# Patient Record
Sex: Female | Born: 1980 | Race: Black or African American | Hispanic: No | Marital: Single | Smoking: Never smoker
Health system: Southern US, Community
[De-identification: ages and names within clinical notes are randomized; demographics above are authoritative.]

## PROBLEM LIST (undated history)

## (undated) DIAGNOSIS — Z789 Other specified health status: Secondary | ICD-10-CM

## (undated) HISTORY — PX: LIPOMA EXCISION: SHX5283

---
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
## 2001-07-03 ENCOUNTER — Encounter: Admission: RE | Admit: 2001-07-03 | Discharge: 2001-07-03 | Payer: Self-pay | Admitting: Family Medicine

## 2003-04-12 ENCOUNTER — Emergency Department (HOSPITAL_COMMUNITY): Admission: EM | Admit: 2003-04-12 | Discharge: 2003-04-12 | Payer: Self-pay | Admitting: Emergency Medicine

## 2006-11-21 ENCOUNTER — Encounter (INDEPENDENT_AMBULATORY_CARE_PROVIDER_SITE_OTHER): Payer: Self-pay | Admitting: *Deleted

## 2012-05-29 ENCOUNTER — Emergency Department (HOSPITAL_COMMUNITY): Payer: Self-pay | Admitting: Anesthesiology

## 2012-05-29 ENCOUNTER — Encounter (HOSPITAL_COMMUNITY): Payer: Self-pay | Admitting: *Deleted

## 2012-05-29 ENCOUNTER — Ambulatory Visit (HOSPITAL_COMMUNITY)
Admission: EM | Admit: 2012-05-29 | Discharge: 2012-05-31 | Disposition: A | Discharge status: Home / Self Care | Payer: Self-pay | Attending: General Surgery | Admitting: General Surgery

## 2012-05-29 ENCOUNTER — Ambulatory Visit: Payer: Self-pay | Admitting: Family Medicine

## 2012-05-29 ENCOUNTER — Encounter (HOSPITAL_COMMUNITY)
Admission: EM | Disposition: A | Discharge status: Home / Self Care | Payer: Self-pay | Source: Home / Self Care | Attending: Emergency Medicine

## 2012-05-29 ENCOUNTER — Encounter (HOSPITAL_COMMUNITY): Payer: Self-pay | Admitting: Anesthesiology

## 2012-05-29 ENCOUNTER — Emergency Department (HOSPITAL_COMMUNITY): Payer: Self-pay

## 2012-05-29 VITALS — BP 108/80 | HR 83 | Temp 98.1°F | Resp 16 | Ht 63.0 in | Wt 193.0 lb

## 2012-05-29 DIAGNOSIS — R1032 Left lower quadrant pain: Secondary | ICD-10-CM

## 2012-05-29 DIAGNOSIS — K3580 Unspecified acute appendicitis: Secondary | ICD-10-CM | POA: Insufficient documentation

## 2012-05-29 DIAGNOSIS — K37 Unspecified appendicitis: Secondary | ICD-10-CM

## 2012-05-29 DIAGNOSIS — K5730 Diverticulosis of large intestine without perforation or abscess without bleeding: Secondary | ICD-10-CM | POA: Insufficient documentation

## 2012-05-29 DIAGNOSIS — R1031 Right lower quadrant pain: Secondary | ICD-10-CM

## 2012-05-29 HISTORY — DX: Other specified health status: Z78.9

## 2012-05-29 HISTORY — PX: LAPAROSCOPIC APPENDECTOMY: SHX408

## 2012-05-29 LAB — POCT CBC
Granulocyte percent: 69.7 % (ref 37–80)
HCT, POC: 43.4 % (ref 37.7–47.9)
Hemoglobin: 14.1 g/dL (ref 12.2–16.2)
Lymph, poc: 3.7 — AB (ref 0.6–3.4)
MCH, POC: 30.9 pg (ref 27–31.2)
MCHC: 32.5 g/dL (ref 31.8–35.4)
MCV: 95.2 fL (ref 80–97)
MID (cbc): 0.8 (ref 0–0.9)
MPV: 8.1 fL (ref 0–99.8)
POC Granulocyte: 10.5 — AB (ref 2–6.9)
POC LYMPH PERCENT: 24.8 %L (ref 10–50)
POC MID %: 5.5 % (ref 0–12)
Platelet Count, POC: 203 10*3/uL (ref 142–424)
RBC: 4.56 M/uL (ref 4.04–5.48)
RDW, POC: 13.2 %
WBC: 15 10*3/uL — AB (ref 4.6–10.2)

## 2012-05-29 LAB — POCT UA - MICROSCOPIC ONLY
Casts, Ur, LPF, POC: NEGATIVE
Crystals, Ur, HPF, POC: NEGATIVE
Yeast, UA: NEGATIVE

## 2012-05-29 LAB — COMPREHENSIVE METABOLIC PANEL
AST: 14 U/L (ref 0–37)
Albumin: 3.6 g/dL (ref 3.5–5.2)
Alkaline Phosphatase: 49 U/L (ref 39–117)
Chloride: 100 mEq/L (ref 96–112)
Creatinine, Ser: 0.7 mg/dL (ref 0.50–1.10)
Potassium: 3.2 mEq/L — ABNORMAL LOW (ref 3.5–5.1)
Total Bilirubin: 0.5 mg/dL (ref 0.3–1.2)
Total Protein: 7.5 g/dL (ref 6.0–8.3)

## 2012-05-29 LAB — POCT URINALYSIS DIPSTICK
Bilirubin, UA: NEGATIVE
Blood, UA: NEGATIVE
Glucose, UA: NEGATIVE
Ketones, UA: >=160
Leukocytes, UA: NEGATIVE
Nitrite, UA: NEGATIVE
Spec Grav, UA: >=1.03
Urobilinogen, UA: 0.2
pH, UA: 5

## 2012-05-29 LAB — POCT URINE PREGNANCY: Preg Test, Ur: NEGATIVE

## 2012-05-29 LAB — CBC WITH DIFFERENTIAL/PLATELET
Basophils Absolute: 0 10*3/uL (ref 0.0–0.1)
Basophils Relative: 0 % (ref 0–1)
Eosinophils Absolute: 0 10*3/uL (ref 0.0–0.7)
MCHC: 35.1 g/dL (ref 30.0–36.0)
Neutro Abs: 9.4 10*3/uL — ABNORMAL HIGH (ref 1.7–7.7)
Neutrophils Relative %: 81 % — ABNORMAL HIGH (ref 43–77)
RDW: 12.6 % (ref 11.5–15.5)

## 2012-05-29 LAB — URINALYSIS, ROUTINE W REFLEX MICROSCOPIC
Glucose, UA: NEGATIVE mg/dL
Hgb urine dipstick: NEGATIVE
Leukocytes, UA: NEGATIVE
Protein, ur: NEGATIVE mg/dL
pH: 5.5 (ref 5.0–8.0)

## 2012-05-29 LAB — PREGNANCY, URINE: Preg Test, Ur: NEGATIVE

## 2012-05-29 SURGERY — APPENDECTOMY, LAPAROSCOPIC
Anesthesia: General | Site: Abdomen | Wound class: Dirty or Infected

## 2012-05-29 MED ORDER — DEXTROSE 5 % IV SOLN
2.0000 g | INTRAVENOUS | Status: DC | PRN
  Administered 2012-05-29: 2 g via INTRAVENOUS

## 2012-05-29 MED ORDER — LACTATED RINGERS IV SOLN: INTRAVENOUS | Status: DC

## 2012-05-29 MED ORDER — GLYCOPYRROLATE 0.2 MG/ML IJ SOLN
INTRAMUSCULAR | Status: DC | PRN
  Administered 2012-05-29: .3 mg via INTRAVENOUS
  Administered 2012-05-29: 0.1 mg via INTRAVENOUS

## 2012-05-29 MED ORDER — PROPOFOL 10 MG/ML IV EMUL
INTRAVENOUS | Status: DC | PRN
  Administered 2012-05-29: 200 mg via INTRAVENOUS

## 2012-05-29 MED ORDER — BUPIVACAINE HCL (PF) 0.25 % IJ SOLN
INTRAMUSCULAR | Status: AC
  Filled 2012-05-29: qty 30

## 2012-05-29 MED ORDER — ACETAMINOPHEN 10 MG/ML IV SOLN
INTRAVENOUS | Status: AC
  Filled 2012-05-29: qty 100

## 2012-05-29 MED ORDER — SUCCINYLCHOLINE CHLORIDE 20 MG/ML IJ SOLN
INTRAMUSCULAR | Status: DC | PRN
  Administered 2012-05-29: 140 mg via INTRAVENOUS

## 2012-05-29 MED ORDER — HYDROMORPHONE HCL PF 1 MG/ML IJ SOLN
0.2500 mg | INTRAMUSCULAR | Status: DC | PRN
  Administered 2012-05-30: 0.25 mg via INTRAVENOUS

## 2012-05-29 MED ORDER — LIDOCAINE HCL (CARDIAC) 20 MG/ML IV SOLN
INTRAVENOUS | Status: DC | PRN
  Administered 2012-05-29: 100 mg via INTRAVENOUS

## 2012-05-29 MED ORDER — NEOSTIGMINE METHYLSULFATE 1 MG/ML IJ SOLN
INTRAMUSCULAR | Status: DC | PRN
  Administered 2012-05-29: 3 mg via INTRAVENOUS

## 2012-05-29 MED ORDER — SODIUM CHLORIDE 0.9 % IV BOLUS (SEPSIS)
1000.0000 mL | Freq: Once | INTRAVENOUS | Status: AC
  Administered 2012-05-29: 1000 mL via INTRAVENOUS

## 2012-05-29 MED ORDER — FENTANYL CITRATE 0.05 MG/ML IJ SOLN
100.0000 ug | Freq: Once | INTRAMUSCULAR | Status: AC
  Administered 2012-05-29: 100 ug via INTRAVENOUS
  Filled 2012-05-29: qty 2

## 2012-05-29 MED ORDER — IOHEXOL 300 MG/ML  SOLN
100.0000 mL | Freq: Once | INTRAMUSCULAR | Status: AC | PRN
  Administered 2012-05-29: 100 mL via INTRAVENOUS

## 2012-05-29 MED ORDER — ONDANSETRON HCL 4 MG/2ML IJ SOLN
INTRAMUSCULAR | Status: DC | PRN
  Administered 2012-05-29 (×2): 2 mg via INTRAVENOUS

## 2012-05-29 MED ORDER — LACTATED RINGERS IR SOLN
Status: DC | PRN
  Administered 2012-05-29: 500 mL

## 2012-05-29 MED ORDER — PROMETHAZINE HCL 25 MG/ML IJ SOLN: 6.2500 mg | INTRAMUSCULAR | Status: DC | PRN

## 2012-05-29 MED ORDER — ACETAMINOPHEN 10 MG/ML IV SOLN
INTRAVENOUS | Status: DC | PRN
  Administered 2012-05-29: 1000 mg via INTRAVENOUS

## 2012-05-29 MED ORDER — ONDANSETRON HCL 4 MG/2ML IJ SOLN
4.0000 mg | Freq: Once | INTRAMUSCULAR | Status: AC
  Administered 2012-05-29: 4 mg via INTRAVENOUS
  Filled 2012-05-29: qty 2

## 2012-05-29 MED ORDER — MORPHINE SULFATE 4 MG/ML IJ SOLN
4.0000 mg | Freq: Once | INTRAMUSCULAR | Status: AC
  Administered 2012-05-29: 4 mg via INTRAVENOUS
  Filled 2012-05-29: qty 1

## 2012-05-29 MED ORDER — FENTANYL CITRATE 0.05 MG/ML IJ SOLN
INTRAMUSCULAR | Status: DC | PRN
  Administered 2012-05-29 (×4): 50 ug via INTRAVENOUS

## 2012-05-29 MED ORDER — BUPIVACAINE HCL (PF) 0.25 % IJ SOLN
INTRAMUSCULAR | Status: DC | PRN
  Administered 2012-05-29: 21 mL

## 2012-05-29 MED ORDER — CISATRACURIUM BESYLATE (PF) 10 MG/5ML IV SOLN
INTRAVENOUS | Status: DC | PRN
  Administered 2012-05-29: 2 mg via INTRAVENOUS
  Administered 2012-05-29: 5 mg via INTRAVENOUS

## 2012-05-29 MED ORDER — DEXAMETHASONE SODIUM PHOSPHATE 10 MG/ML IJ SOLN
INTRAMUSCULAR | Status: DC | PRN
  Administered 2012-05-29: 10 mg via INTRAVENOUS

## 2012-05-29 MED ORDER — CEFOXITIN SODIUM-DEXTROSE 1-4 GM-% IV SOLR (PREMIX)
INTRAVENOUS | Status: AC
  Filled 2012-05-29: qty 100

## 2012-05-29 MED ORDER — SODIUM CHLORIDE 0.9 % IV SOLN
INTRAVENOUS | Status: DC | PRN
  Administered 2012-05-29: 21:00:00 via INTRAVENOUS

## 2012-05-29 MED ORDER — LACTATED RINGERS IV SOLN
INTRAVENOUS | Status: DC | PRN
  Administered 2012-05-29 (×2): via INTRAVENOUS

## 2012-05-29 MED ORDER — MIDAZOLAM HCL 5 MG/5ML IJ SOLN
INTRAMUSCULAR | Status: DC | PRN
  Administered 2012-05-29: .5 mg via INTRAVENOUS
  Administered 2012-05-29: 1 mg via INTRAVENOUS

## 2012-05-29 SURGICAL SUPPLY — 41 items
ADH SKN CLS APL DERMABOND .7 (GAUZE/BANDAGES/DRESSINGS) ×1
APL SKNCLS STERI-STRIP NONHPOA (GAUZE/BANDAGES/DRESSINGS)
APPLIER CLIP ROT 10 11.4 M/L (STAPLE)
APR CLP MED LRG 11.4X10 (STAPLE)
BAG SPEC RTRVL LRG 6X4 10 (ENDOMECHANICALS) ×1
BENZOIN TINCTURE PRP APPL 2/3 (GAUZE/BANDAGES/DRESSINGS) IMPLANT
CANISTER SUCTION 2500CC (MISCELLANEOUS) ×2 IMPLANT
CATH FOLEY LATEX FREE 16FR (CATHETERS) ×2 IMPLANT
CLIP APPLIE ROT 10 11.4 M/L (STAPLE) IMPLANT
CLOTH BEACON ORANGE TIMEOUT ST (SAFETY) ×2 IMPLANT
CUTTER FLEX LINEAR 45M (STAPLE) ×2 IMPLANT
DECANTER SPIKE VIAL GLASS SM (MISCELLANEOUS) ×2 IMPLANT
DERMABOND ADVANCED (GAUZE/BANDAGES/DRESSINGS) ×1
DERMABOND ADVANCED .7 DNX12 (GAUZE/BANDAGES/DRESSINGS) ×1 IMPLANT
DRAPE LAPAROSCOPIC ABDOMINAL (DRAPES) ×2 IMPLANT
ELECT REM PT RETURN 9FT ADLT (ELECTROSURGICAL) ×2
ELECTRODE REM PT RTRN 9FT ADLT (ELECTROSURGICAL) ×1 IMPLANT
ENDOLOOP SUT PDS II  0 18 (SUTURE)
ENDOLOOP SUT PDS II 0 18 (SUTURE) IMPLANT
GLOVE BIOGEL PI IND STRL 7.0 (GLOVE) ×1 IMPLANT
GLOVE BIOGEL PI INDICATOR 7.0 (GLOVE) ×1
GLOVE SURG SIGNA 7.5 PF LTX (GLOVE) ×2 IMPLANT
GOWN STRL NON-REIN LRG LVL3 (GOWN DISPOSABLE) ×2 IMPLANT
GOWN STRL REIN XL XLG (GOWN DISPOSABLE) ×4 IMPLANT
KIT BASIN OR (CUSTOM PROCEDURE TRAY) ×2 IMPLANT
PENCIL BUTTON HOLSTER BLD 10FT (ELECTRODE) IMPLANT
POUCH SPECIMEN RETRIEVAL 10MM (ENDOMECHANICALS) ×2 IMPLANT
RELOAD 45 VASCULAR/THIN (ENDOMECHANICALS) IMPLANT
RELOAD STAPLE TA45 3.5 REG BLU (ENDOMECHANICALS) ×2 IMPLANT
SCALPEL HARMONIC ACE (MISCELLANEOUS) ×2 IMPLANT
SET IRRIG TUBING LAPAROSCOPIC (IRRIGATION / IRRIGATOR) ×2 IMPLANT
SOLUTION ANTI FOG 6CC (MISCELLANEOUS) ×2 IMPLANT
STRIP CLOSURE SKIN 1/4X3 (GAUZE/BANDAGES/DRESSINGS) ×1 IMPLANT
SUT MON AB 5-0 PS2 18 (SUTURE) ×2 IMPLANT
TOWEL OR 17X26 10 PK STRL BLUE (TOWEL DISPOSABLE) ×2 IMPLANT
TRAY FOLEY CATH 14FRSI W/METER (CATHETERS) ×1 IMPLANT
TRAY LAP CHOLE (CUSTOM PROCEDURE TRAY) ×2 IMPLANT
TROCAR XCEL BLUNT TIP 100MML (ENDOMECHANICALS) ×2 IMPLANT
TROCAR Z-THREAD FIOS 11X100 BL (TROCAR) ×2 IMPLANT
TROCAR Z-THREAD FIOS 5X100MM (TROCAR) ×4 IMPLANT
TUBING INSUFFLATION 10FT LAP (TUBING) ×2 IMPLANT

## 2012-05-29 NOTE — ED Notes (Signed)
Went to bedside to evaluate patient and patient was not on stretcher.

## 2012-05-29 NOTE — ED Notes (Signed)
Patient states that she has been having RLQ pain since yesterday at 1700. Patient denies nausea, vomiting or fever. Patient states she went to Urgent care today and they told her that her WBC count was elevated. Patient continues to have RLQ that is tender to palpation. States pain is is constant but is worse with ambulation.

## 2012-05-29 NOTE — Progress Notes (Signed)
[facility]:  Office Visit  Chief Complaint:  Chief Complaint  Patient presents with  . Abdominal Pain    x 1 day  RLQ sharp intermittent    HPI: Jill Bell is a 31 y.o. female who complains of  RLQ abd pain, sharp 8/10 with movement and at rest started yesterday. No appetite. Denies Fevers, chills. + Dysuria. No vaginal dc. Denies being bothered by food. Denies being pregnant, she is due for her menstrual cycle. She denies having ovarian cysts.   History reviewed. No pertinent past medical history. Past Surgical History  Procedure Date  . Lipoma excision    History   Social History  . Marital Status: Single    Spouse Name: N/A    Number of Children: N/A  . Years of Education: N/A   Social History Main Topics  . Smoking status: Never Smoker   . Smokeless tobacco: None  . Alcohol Use: No  . Drug Use: No  . Sexually Active: None   Other Topics Concern  . None   Social History Narrative  . None   Family History  Problem Relation Age of Onset  . Diabetes Mother   . Heart disease Mother   . Asthma Mother   . Aneurysm Father    No Known Allergies Prior to Admission medications   Not on File     ROS: The patient denies fevers, chills, night sweats, unintentional weight loss, chest pain, palpitations, wheezing, dyspnea on exertion, nausea, vomiting, hematuria, melena, numbness, weakness, or tingling.   All other systems have been reviewed and were otherwise negative with the exception of those mentioned in the HPI and as above.    PHYSICAL EXAM: Filed Vitals:   05/29/12 1347  BP: 108/80  Pulse: 83  Temp: 98.1 F (36.7 C)  Resp: 16   Filed Vitals:   05/29/12 1347  Height: 5\' 3"  (1.6 m)  Weight: 193 lb (87.544 kg)   Body mass index is 34.19 kg/(m^2).  General: Alert, no acute distress HEENT:  Normocephalic, atraumatic, oropharynx patent.  Cardiovascular:  Regular rate and rhythm, no rubs murmurs or gallops.  No Carotid bruits,  radial pulse intact. No pedal edema.  Respiratory: Clear to auscultation bilaterally.  No wheezes, rales, or rhonchi.  No cyanosis, no use of accessory musculature GI: No organomegaly, abdomen is soft. noo rebound, mild guarding, + moderately tender, positive bowel sounds.  No masses. Skin: No rashes. Neurologic: Facial musculature symmetric. Psychiatric: Patient is appropriate throughout our interaction. Lymphatic: No cervical lymphadenopathy Musculoskeletal: Gait intact.   LABS: Results for orders placed in visit on 05/29/12  POCT CBC      Component Value Range   WBC 15.0 (*) 4.6 - 10.2 K/uL   Lymph, poc 3.7 (*) 0.6 - 3.4   POC LYMPH PERCENT 24.8  10 - 50 %L   MID (cbc) 0.8  0 - 0.9   POC MID % 5.5  0 - 12 %M   POC Granulocyte 10.5 (*) 2 - 6.9   Granulocyte percent 69.7  37 - 80 %G   RBC 4.56  4.04 - 5.48 M/uL   Hemoglobin 14.1  12.2 - 16.2 g/dL   HCT, POC 81.1  91.4 - 47.9 %   MCV 95.2  80 - 97 fL   MCH, POC 30.9  27 - 31.2 pg   MCHC 32.5  31.8 - 35.4 g/dL   RDW, POC 78.2     Platelet Count, POC 203  142 -  424 K/uL   MPV 8.1  0 - 99.8 fL  POCT URINALYSIS DIPSTICK      Component Value Range   Color, UA yellow     Clarity, UA clear     Glucose, UA neg     Bilirubin, UA neg     Ketones, UA >=160 mg/dL     Spec Grav, UA >=9.147     Blood, UA neg     pH, UA 5.0     Protein, UA trace     Urobilinogen, UA 0.2     Nitrite, UA neg     Leukocytes, UA Negative    POCT UA - MICROSCOPIC ONLY      Component Value Range   WBC, Ur, HPF, POC 0-2     RBC, urine, microscopic 0-1     Bacteria, U Microscopic trace     Mucus, UA large     Epithelial cells, urine per micros 0-3     Crystals, Ur, HPF, POC neg     Casts, Ur, LPF, POC neg     Yeast, UA neg    POCT URINE PREGNANCY      Component Value Range   Preg Test, Ur Negative       EKG/XRAY:   Primary read interpreted by Dr. Conley Rolls at [facility].   ASSESSMENT/PLAN:  1. RLQ pain-? Appendicitis . PAtient has WBC of 15. Urine  Analysis was negative. Patient advise to go to ER. [address]Hamilton Capri PHUONG, DO 05/29/2012 3:05 PM

## 2012-05-29 NOTE — ED Provider Notes (Signed)
History     CSN: [id number]  Arrival date & time 05/29/12  1552   First MD Initiated Contact with Patient 05/29/12 1907      Chief Complaint  Patient presents with  . Abdominal Pain    (Consider location/radiation/quality/duration/timing/severity/associated sxs/prior treatment) HPI Comments: On patient presents with right lower quadrant pain it started yesterday associated with nausea. She presents from urgent care will leukocytosis of 1500. She denies any urine area or vaginal symptoms. Her last period was 3 weeks ago. Poor by mouth intake today. Denies fevers. No chest pain or shortness of breath  The history is provided by the patient.    History reviewed. No pertinent past medical history.  Past Surgical History  Procedure Date  . Lipoma excision     Family History  Problem Relation Age of Onset  . Diabetes Mother   . Heart disease Mother   . Asthma Mother   . Aneurysm Father     History  Substance Use Topics  . Smoking status: Never Smoker   . Smokeless tobacco: Not on file  . Alcohol Use: No    OB History    Grav Para Term Preterm Abortions TAB SAB Ect Mult Living                  Review of Systems  Constitutional: Negative for fever, activity change and appetite change.  HENT: Negative for congestion and rhinorrhea.   Respiratory: Negative for cough, chest tightness and shortness of breath.   Cardiovascular: Negative for chest pain.  Gastrointestinal: Positive for nausea and abdominal pain. Negative for vomiting.  Genitourinary: Negative for dysuria, vaginal bleeding and vaginal discharge.  Musculoskeletal: Negative for back pain.  Skin: Negative for rash.  Neurological: Negative for dizziness, weakness and headaches.    Allergies  Review of patient's allergies indicates no known allergies.  Home Medications   Current Outpatient Rx  Name Route Sig Dispense Refill  . HYDROCODONE-ACETAMINOPHEN 5-325 MG PO TABS Oral Take 1 tablet by mouth every 6  (six) hours as needed. Pain    . ADULT MULTIVITAMIN W/MINERALS CH Oral Take 1 tablet by mouth daily.      BP 119/74  Pulse 86  Temp 98.7 F (37.1 C) (Oral)  Resp 20  Wt 194 lb (87.998 kg)  SpO2 100%  LMP 05/08/2012  Physical Exam  Constitutional: She is oriented to person, place, and time. She appears well-developed and well-nourished.       Uncomfortable  HENT:  Head: Normocephalic and atraumatic.  Mouth/Throat: Oropharynx is clear and moist.  Eyes: Conjunctivae and EOM are normal. Pupils are equal, round, and reactive to light.  Neck: Normal range of motion. Neck supple.  Cardiovascular: Normal rate, regular rhythm and normal heart sounds.   Pulmonary/Chest: Effort normal and breath sounds normal. No respiratory distress.  Abdominal: Soft. There is tenderness. There is no rebound and no guarding.       Tender to palpation right lower quadrant with guarding  Musculoskeletal: Normal range of motion. She exhibits no edema and no tenderness.  Neurological: She is alert and oriented to person, place, and time. No cranial nerve deficit.  Skin: Skin is warm.    ED Course  Procedures (including critical care time)   Labs Reviewed  CBC WITH DIFFERENTIAL  COMPREHENSIVE METABOLIC PANEL  URINALYSIS, ROUTINE W REFLEX MICROSCOPIC  PREGNANCY, URINE   Ct Abdomen Pelvis W Contrast  05/29/2012  *RADIOLOGY REPORT*  Clinical Data: Right lower quadrant pain.  Elevated white blood cell  count.  CT ABDOMEN AND PELVIS WITH CONTRAST  Technique:  Multidetector CT imaging of the abdomen and pelvis was performed following the standard protocol during bolus administration of intravenous contrast.  Contrast: OMNIPAQUE IOHEXOL 300 MG/ML  SOLN  Comparison: No priors.  Findings:  Lung Bases: Unremarkable.  Abdomen/Pelvis:  The appendix is dilated up to 12 mm in diameter and appears inflamed.  Image 67 of series 2 demonstrates an appendicolith at the neck of the appendix.  Inflammatory changes are seen  surrounding the appendix and the adjacent cecum, without a well-defined periappendiceal abscess at this time. No pneumoperitoneum.  The enhanced appearance of the liver, gallbladder, pancreas, spleen, bilateral adrenal glands and bilateral kidneys is unremarkable.  There is a trace volume of free fluid in the cul-de- sac, presumably physiologic in this young female patient.  Uterus and left ovary are unremarkable in appearance.  Probable small degenerating corpus luteum cyst in the left ovary.  There are a few colonic diverticula, without surrounding inflammatory changes to suggest acute diverticulitis at this time.  Urinary bladder is unremarkable in appearance.  Musculoskeletal: There are no aggressive appearing lytic or blastic lesions noted in the visualized portions of the skeleton.  IMPRESSION: 1.  Acute appendicitis secondary to an obstructing appendicolith in the neck of the appendix.  At this time, there are extensive surrounding periappendiceal inflammatory changes, but no definite periappendiceal abscess or evidence to suggest frank perforation. 2.  Additional incidental findings, as above.  Critical Value/emergent results were called by telephone at the time of interpretation on 05/29/2012 at 07:33 p.m. to Dr. Rhunette Croft, who verbally acknowledged these results.   Original Report Authenticated By: Florencia Reasons, M.D.      No diagnosis found.    MDM  Right lower quadrant pain with nausea. Vital stable, no fevers  CT scan confirmed appendicitis with appendicolith. IVF, symptom control, invanz.  D/w Dr. Ezzard Standing who will take patient to OR.      Glynn Octave, MD 05/29/12 [phone]

## 2012-05-29 NOTE — Anesthesia Procedure Notes (Signed)
Procedure Name: Intubation Date/Time: 05/29/2012 10:10 PM Performed by: Edison Pace Pre-anesthesia Checklist: Patient identified, Timeout performed, Emergency Drugs available, Suction available and Patient being monitored Patient Re-evaluated:Patient Re-evaluated prior to inductionOxygen Delivery Method: Circle system utilized Preoxygenation: Pre-oxygenation with 100% oxygen Intubation Type: Cricoid Pressure applied, Rapid sequence and IV induction Laryngoscope Size: Mac and 4 Grade View: Grade II Tube type: Oral Tube size: 7.5 mm Number of attempts: 1 Airway Equipment and Method: Stylet

## 2012-05-29 NOTE — ED Notes (Signed)
Rancour, MD at bedside. 

## 2012-05-29 NOTE — Anesthesia Preprocedure Evaluation (Signed)
Anesthesia Evaluation  Patient identified by MRN, date of birth, ID band Patient awake    Reviewed: Allergy & Precautions, H&P , NPO status , Patient's Chart, lab work & pertinent test results  Airway Mallampati: II TM Distance: >3 FB Neck ROM: Full    Dental  (+) Teeth Intact and Dental Advisory Given   Pulmonary neg pulmonary ROS,  breath sounds clear to auscultation  Pulmonary exam normal       Cardiovascular negative cardio ROS  Rhythm:Regular Rate:Normal     Neuro/Psych negative neurological ROS  negative psych ROS   GI/Hepatic negative GI ROS, Neg liver ROS,   Endo/Other  Morbid obesity  Renal/GU negative Renal ROS  negative genitourinary   Musculoskeletal negative musculoskeletal ROS (+)   Abdominal   Peds negative pediatric ROS (+)  Hematology negative hematology ROS (+)   Anesthesia Other Findings   Reproductive/Obstetrics negative OB ROS                           Anesthesia Physical Anesthesia Plan  ASA: II and Emergent  Anesthesia Plan: General   Post-op Pain Management:    Induction: Intravenous, Rapid sequence and Cricoid pressure planned  Airway Management Planned: Oral ETT  Additional Equipment:   Intra-op Plan:   Post-operative Plan: Extubation in OR  Informed Consent: I have reviewed the patients History and Physical, chart, labs and discussed the procedure including the risks, benefits and alternatives for the proposed anesthesia with the patient or authorized representative who has indicated his/her understanding and acceptance.   Dental advisory given  Plan Discussed with: CRNA  Anesthesia Plan Comments:         Anesthesia Quick Evaluation

## 2012-05-29 NOTE — ED Notes (Signed)
Pt reports RLQ pain which started yesterday without n/v/d.  Pt denies any fever at this time.   Pt reports coming from UC and was instructed to come to the ED to r/o appy.  Pt reports elevated WBC.

## 2012-05-29 NOTE — Transfer of Care (Signed)
Immediate Anesthesia Transfer of Care Note  Patient: Jill Bell  Procedure(s) Performed: Procedure(s) (LRB) with comments: APPENDECTOMY LAPAROSCOPIC (N/A)  Patient Location: PACU  Anesthesia Type: General  Level of Consciousness: awake, oriented, patient cooperative, lethargic and responds to stimulation  Airway & Oxygen Therapy: Patient Spontanous Breathing and Patient connected to face mask oxygen  Post-op Assessment: Report given to PACU RN, Post -op Vital signs reviewed and stable and Patient moving all extremities  Post vital signs: Reviewed and stable  Complications: No apparent anesthesia complications

## 2012-05-29 NOTE — Preoperative (Signed)
Beta Blockers   Reason not to administer Beta Blockers:Not Applicable 

## 2012-05-29 NOTE — ED Provider Notes (Signed)
MSE initiated.  Patient complaining of RLQ pain onset yesterday.  Denies N/V/D, fever.  Abdomen tender to palpation RLQ.  Lungs CTA bilaterally.  S1/S2, RRR.  Patient appears uncomfortable.  Patient evaluated earlier today at urgent care and sent to ED for evaluation to r/o appendicitis.  Labs reviewed.  WBC count 15,000.  No UTI.  Not pregnant.  Orders initiated.  Jimmye Norman, NP 05/29/12 1726

## 2012-05-29 NOTE — H&P (Signed)
Re:   Jill Bell DOB:   [birth year] MRN:   [id number]  ASSESSMENT AND PLAN: 1.  Acute appendicitis  I discussed with the patient the indications and risks of appendiceal surgery.  The primary risks of appendiceal surgery include, but are not limited to, bleeding, infection, bowel surgery, and open surgery.  There is also the risk that the patient may have continued symptoms after surgery.  However, the likelihood of improvement in symptoms and return to the patient's normal status is good. We discussed the typical post-operative recovery course. I tried to answer the patient's questions.  Her mother is at the bedside.  2.  Few colonic diverticula on CT scan.  Chief Complaint  Patient presents with  . Abdominal Pain   REFERRING PHYSICIAN:  Dr. Randel Books, Janyce Llanos  HISTORY OF PRESENT ILLNESS: Jill Bell is a 31 y.o. (DOB: [birth year])  AA female whose primary care physician is No primary provider on file. and comes to the [facility] with abdominal pain.  Evaluation in the [facility] revealed that she has acute appendicitis.  Jill Bell started having pain yesterday around 5 PM.  She hurt all night.  But she tried to go to work today.  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  But because of pain, she went to an Urgent Care.  I have none of their notes.  They sent her to the [facility].  She was evaluated by Dr. Kathie Rhodes. Rancour.  A CT scan showed acute appendicitis.  She has had no abdominal surgery.  She has no history of stomach disease.  No history of liver disease.  No history of gall bladder disease.  No history of pancreas disease.  No history of colon disease.   Past Medical History  Diagnosis Date  . No pertinent past medical history       Past Surgical History  Procedure Date  . Lipoma excision       Current Facility-Administered Medications  Medication Dose Route Frequency Provider Last Rate Last Dose  . fentaNYL (SUBLIMAZE) injection 100 mcg  100 mcg Intravenous Once Jimmye Norman, NP   100 mcg at 05/29/12 1754  . iohexol (OMNIPAQUE) 300 MG/ML solution 100 mL  100 mL Intravenous Once PRN Medication Radiologist, MD   100 mL at 05/29/12 1909  . morphine 4 MG/ML injection 4 mg  4 mg Intravenous Once Glynn Octave, MD   4 mg at 05/29/12 2015  . ondansetron (ZOFRAN) injection 4 mg  4 mg Intravenous Once Glynn Octave, MD   4 mg at 05/29/12 2014  . sodium chloride 0.9 % bolus 1,000 mL  1,000 mL Intravenous Once Glynn Octave, MD   1,000 mL at 05/29/12 2013   Current Outpatient Prescriptions  Medication Sig Dispense Refill  . HYDROcodone-acetaminophen (NORCO/VICODIN) 5-325 MG per tablet Take 1 tablet by mouth every 6 (six) hours as needed. Pain      . Multiple Vitamin (MULTIVITAMIN WITH MINERALS) TABS Take 1 tablet by mouth daily.          Allergies  Allergen Reactions  . Latex Hives  . Avelox (Moxifloxacin Hcl In Nacl)     Pt's mother is allergic to avelox and pt doesn't want to take this    REVIEW OF SYSTEMS: Skin:  She had a lipoma taken off her left forehead about 2 weeks ago by Dr. Leandrew Koyanagi. Infection:  No history of hepatitis or HIV.  No history of MRSA. Neurologic:  No history of stroke.  No history of seizure.  No history of headaches. Cardiac:  No history of hypertension. No history of heart disease.  No history of prior cardiac catheterization.  No history of seeing a cardiologist. Pulmonary:  Does not smoke cigarettes.  No asthma or bronchitis.  No OSA/CPAP.  Endocrine:  No diabetes. No thyroid disease. Gastrointestinal:  See HPI Urologic:  No history of kidney stones.  No history of bladder infections. Musculoskeletal:  No history of joint or back disease. Hematologic:  No bleeding disorder.  No history of anemia.  Not anticoagulated. Psycho-social:  The patient is oriented.   The patient has no obvious psychologic or social impairment to understanding our conversation and plan.  SOCIAL and FAMILY HISTORY: Unmarried. Lives with Mom.  Her  mother is in the ER with her. She works for [employer].  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  PHYSICAL EXAM: BP 119/74  Pulse 86  Temp 98.7 F (37.1 C) (Oral)  Resp 20  Wt 194 lb (87.998 kg)  SpO2 100%  LMP 05/08/2012  General: WN AA F who is alert, but she clearly does not feel well.  HEENT: Normal. Pupils equal. Neck: Supple. No mass.  No thyroid mass. Lymph Nodes:  No supraclavicular or cervical nodes. Lungs: Clear to auscultation and symmetric breath sounds. Heart:  RRR. No murmur or rub.  Abdomen: Soft. No mass.  No hernia. Decreased bowel sounds.  No abdominal scars. Tender in the RLQ with some guarding. Rectal: Not done. Extremities:  Good strength and ROM  in upper and lower extremities. Neurologic:  Grossly intact to motor and sensory function. Psychiatric: Has normal mood and affect. Behavior is normal.   DATA REVIEWED: CT scan and labs.  Jill Kin, MD,  [facility] [address]  [address]   [place], [place]    [address] Phone:  [phone] FAX:  [phone]

## 2012-05-29 NOTE — Op Note (Signed)
Re:   Jill Bell DOB:   03-30-1981 MRN:   [id number]                   FACILITY:  [facility]  DATE OF PROCEDURE: 6 Sept 2013                              OPERATIVE REPORT  PREOPERATIVE DIAGNOSIS:  Appendicitis  POSTOPERATIVE DIAGNOSIS:  Acute suppurative appendicitis.  PROCEDURE:  Laparoscopic appendectomy.  SURGEON:  Sandria Bales. Ezzard Standing, MD  ASSISTANT:  No first assistant.  ANESTHESIA:  General endotracheal.  Einar Pheasant, MD - Anesthesiologist Edison Pace - CRNA  ESTIMATED BLOOD LOSS:  Minimal.  DRAINS: none   SPECIMEN:   Appendix  COUNTS CORRECT:  YES  INDICATIONS FOR PROCEDURE:  Jill Bell is a 31 y.o. (DOB: Feb 06, 1981) AA female whose primary care doctor is No primary provider on file., presented to the [facility] with appendicitis, and comes to the OR for an appendectomy.   I discussed with the patient, the indications and potential complications of appendiceal surgery.  The potential complications include, but are not limited to, bleeding, open surgery, bowel resection, and the possibility of another diagnosis.  OPERATIVE NOTE:  The patient underwent a general endotracheal anesthetic as supervised by Einar Pheasant, MD - Anesthesiologist and Edison Pace - CRNA, in room #1.  The patient was given 2 g of cefoxitin at the beginning of the procedure and the abdomen was prepped with ChloraPrep.  The patient had a foley catheter placed at the beginning of the procedure.  A time-out was held and surgical checklist run.  An infraumbilical incision was made with sharp dissection carried down to the abdominal cavity.  A 0 degree 5 mm laparoscope was inserted through a 12 mm Hasson trocar and Hasson trocar secured with a 0 Vicryl suture.  I placed a 5 mm trocar in the right upper quadrant and 5 mm torcar in left lower quadrant and did abdominal exploration.    The right and left lobes of liver unremarkable.  Stomach was unremarkable.  Her pelvic organs were unremarkable.   I saw no other intra-abdominal abnormality.  The patient had appendicitis with the appendix located along the lateral cecum.  There was purulence covering the appendix and surrounding fat.   This is a Class 4 wound.  The appendix was covered with omentum, which was pulled away from the appendix.  I had to remove the appendix "backwards".  I did this in part because the appendix was stuck to the lateral cecal wall and the plane was not clear.   I first exposed the base of the appendix.  I then used a blue load 45 mm Ethicon Endo-GIA stapler and fired this across the base of the appendix.  Then I work down to the tip of the appendix.  The mesentery of the appendix was divided with blunt dissection and a Harmonic scalpel.  After I separated the entire appendix from the lateral wall of the colon, I placed the appendix in EndoCatch bag and delivered the bag through the umbilical incision.  I irrigated the abdomen with 500 cc of saline.  After irrigating the abdomen, I then removed the trocars, in turn.  The umbilical port fascia was closed with 0 Vicryl suture.   I closed the skin each site with a 5-0 Vicryl suture and painted the wounds with Dermabond.  I then injected  a total of 30 mL of 0.25% Marcaine at the incisions.  Sponge and needle count were correct at the end of the case.  The foley catheter was removed in the OR.  The patient was transferred to the recovery room in good condition.  The patient tolerated the procedure well and it depends on the patient's post op clinical course as to when she could be discharged.   Ovidio Kin, MD, [facility] Pager: [phone] Office phone:  [phone]

## 2012-05-30 MED ORDER — ONDANSETRON HCL 4 MG/2ML IJ SOLN: 4.0000 mg | Freq: Four times a day (QID) | INTRAMUSCULAR | Status: DC | PRN

## 2012-05-30 MED ORDER — KCL IN DEXTROSE-NACL 20-5-0.45 MEQ/L-%-% IV SOLN
INTRAVENOUS | Status: DC
  Administered 2012-05-30: 06:00:00 via INTRAVENOUS
  Administered 2012-05-30: 1000 mL via INTRAVENOUS
  Administered 2012-05-30: 14:00:00 via INTRAVENOUS
  Filled 2012-05-30 (×5): qty 1000

## 2012-05-30 MED ORDER — ONDANSETRON HCL 4 MG PO TABS: 4.0000 mg | ORAL_TABLET | Freq: Four times a day (QID) | ORAL | Status: DC | PRN

## 2012-05-30 MED ORDER — HYDROMORPHONE HCL PF 1 MG/ML IJ SOLN
INTRAMUSCULAR | Status: AC
  Filled 2012-05-30: qty 1

## 2012-05-30 MED ORDER — HEPARIN SODIUM (PORCINE) 5000 UNIT/ML IJ SOLN
5000.0000 [IU] | Freq: Three times a day (TID) | INTRAMUSCULAR | Status: DC
  Administered 2012-05-30 – 2012-05-31 (×4): 5000 [IU] via SUBCUTANEOUS
  Filled 2012-05-30 (×9): qty 1

## 2012-05-30 MED ORDER — HYDROCODONE-ACETAMINOPHEN 5-325 MG PO TABS
1.0000 | ORAL_TABLET | ORAL | Status: DC | PRN
  Administered 2012-05-30: 2 via ORAL
  Administered 2012-05-30: 1 via ORAL
  Administered 2012-05-30 – 2012-05-31 (×2): 2 via ORAL
  Filled 2012-05-30 (×3): qty 2
  Filled 2012-05-30: qty 1

## 2012-05-30 MED ORDER — CEFOXITIN SODIUM 2 G IV SOLR
2.0000 g | Freq: Four times a day (QID) | INTRAVENOUS | Status: DC
  Administered 2012-05-30 – 2012-05-31 (×5): 2 g via INTRAVENOUS
  Filled 2012-05-30 (×6): qty 2

## 2012-05-30 MED ORDER — PHENOL 1.4 % MT LIQD
1.0000 | OROMUCOSAL | Status: DC | PRN
  Administered 2012-05-30: 1 via OROMUCOSAL
  Filled 2012-05-30: qty 177

## 2012-05-30 MED ORDER — MORPHINE SULFATE 2 MG/ML IJ SOLN
1.0000 mg | INTRAMUSCULAR | Status: DC | PRN
  Administered 2012-05-30: 1 mg via INTRAVENOUS
  Administered 2012-05-30 (×2): 2 mg via INTRAVENOUS
  Filled 2012-05-30 (×3): qty 1

## 2012-05-30 MED ORDER — IBUPROFEN 600 MG PO TABS
600.0000 mg | ORAL_TABLET | Freq: Four times a day (QID) | ORAL | Status: DC | PRN
  Filled 2012-05-30: qty 1

## 2012-05-30 NOTE — Progress Notes (Signed)
1 Day Post-Op  Subjective: Complains of soreness. Hasn't taken much po yet  Objective: Vital signs in last 24 hours: Temp:  [98.1 F (36.7 C)-99.1 F (37.3 C)] 98.9 F (37.2 C) (09/07 0537) Pulse Rate:  [56-87] 60  (09/07 0537) Resp:  [16-20] 18  (09/07 0537) BP: (98-120)/(51-80) 107/71 mmHg (09/07 0537) SpO2:  [100 %] 100 % (09/07 0537) Weight:  [193 lb (87.544 kg)-194 lb (87.998 kg)] 194 lb (87.998 kg) (09/06 1626) Last BM Date: 05/29/12  Intake/Output from previous day: 09/06 0701 - 09/07 0700 In: 2400 [I.V.:2400] Out: 1260 [Urine:1260] Intake/Output this shift: Total I/O In: 1018.3 [P.O.:460; I.V.:558.3] Out: 800 [Urine:800]  GI: soft, tender at umbilicus. incisions look good  Lab Results:   Basename 05/29/12 2024 05/29/12 1433  WBC 11.6* 15.0*  HGB 13.1 14.1  HCT 37.3 43.4  PLT 325 --   BMET  Basename 05/29/12 2024  NA 133*  K 3.2*  CL 100  CO2 19  GLUCOSE 89  BUN 9  CREATININE 0.70  CALCIUM 8.5   PT/INR No results found for this basename: LABPROT:2,INR:2 in the last 72 hours ABG No results found for this basename: PHART:2,PCO2:2,PO2:2,HCO3:2 in the last 72 hours  Studies/Results: Ct Abdomen Pelvis W Contrast  05/29/2012  *RADIOLOGY REPORT*  Clinical Data: Right lower quadrant pain.  Elevated white blood cell count.  CT ABDOMEN AND PELVIS WITH CONTRAST  Technique:  Multidetector CT imaging of the abdomen and pelvis was performed following the standard protocol during bolus administration of intravenous contrast.  Contrast: OMNIPAQUE IOHEXOL 300 MG/ML  SOLN  Comparison: No priors.  Findings:  Lung Bases: Unremarkable.  Abdomen/Pelvis:  The appendix is dilated up to 12 mm in diameter and appears inflamed.  Image 67 of series 2 demonstrates an appendicolith at the neck of the appendix.  Inflammatory changes are seen surrounding the appendix and the adjacent cecum, without a well-defined periappendiceal abscess at this time. No pneumoperitoneum.  The  enhanced appearance of the liver, gallbladder, pancreas, spleen, bilateral adrenal glands and bilateral kidneys is unremarkable.  There is a trace volume of free fluid in the cul-de- sac, presumably physiologic in this young female patient.  Uterus and left ovary are unremarkable in appearance.  Probable small degenerating corpus luteum cyst in the left ovary.  There are a few colonic diverticula, without surrounding inflammatory changes to suggest acute diverticulitis at this time.  Urinary bladder is unremarkable in appearance.  Musculoskeletal: There are no aggressive appearing lytic or blastic lesions noted in the visualized portions of the skeleton.  IMPRESSION: 1.  Acute appendicitis secondary to an obstructing appendicolith in the neck of the appendix.  At this time, there are extensive surrounding periappendiceal inflammatory changes, but no definite periappendiceal abscess or evidence to suggest frank perforation. 2.  Additional incidental findings, as above.  Critical Value/emergent results were called by telephone at the time of interpretation on 05/29/2012 at 07:33 p.m. to Dr. Rhunette Croft, who verbally acknowledged these results.   Original Report Authenticated By: Jill [name], M.D.     Anti-infectives: Anti-infectives     Start     Dose/Rate Route Frequency Ordered Stop   05/30/12 0600   cefOXitin (MEFOXIN) 2 g in dextrose 5 % 50 mL IVPB        2 g 100 mL/hr over 30 Minutes Intravenous Every 6 hours 05/30/12 0050            Assessment/Plan: [name]/p Procedure([name]) (LRB) with comments: APPENDECTOMY LAPAROSCOPIC (N/A) Advance diet OOB Work on  pain control  LOS: 1 day    TOTH III,Jill [name] 05/30/2012

## 2012-05-30 NOTE — Anesthesia Postprocedure Evaluation (Signed)
  Anesthesia Post-op Note  Patient: Jill [name]  Procedure(s) Performed: Procedure(s) (LRB) with comments: APPENDECTOMY LAPAROSCOPIC (N/A)  Patient Location: PACU  Anesthesia Type: General  Level of Consciousness: awake, alert , oriented and patient cooperative  Airway and Oxygen Therapy: Patient Spontanous Breathing and Patient connected to nasal cannula oxygen  Post-op Pain: mild  Post-op Assessment: Post-op Vital signs reviewed, Patient's Cardiovascular Status Stable, Respiratory Function Stable, Patent Airway, No signs of Nausea or vomiting and Pain level controlled  Post-op Vital Signs: Reviewed and stable  Complications: No apparent anesthesia complications

## 2012-05-30 NOTE — ED Provider Notes (Signed)
Medical screening examination/treatment/procedure(s) were performed by non-physician practitioner and as supervising physician I was immediately available for consultation/collaboration.  Dr. Manus Gunning taking over care of this patient.  Derwood Kaplan, MD 05/30/12 [phone]

## 2012-05-31 MED ORDER — HYDROCODONE-ACETAMINOPHEN 5-325 MG PO TABS: 1.0000 | ORAL_TABLET | Freq: Four times a day (QID) | ORAL | Status: AC | PRN

## 2012-05-31 MED ORDER — SULFAMETHOXAZOLE-TRIMETHOPRIM 800-160 MG PO TABS: 1.0000 | ORAL_TABLET | Freq: Two times a day (BID) | ORAL | Status: AC

## 2012-05-31 NOTE — Progress Notes (Signed)
Patient discharged via wheelchair. Assessment unchanged. rx given for vicodin and septra. Informed patient that per Dr. Ezzard Standing she can take colace but not miralax. States understanding of discharge instructions.

## 2012-05-31 NOTE — Progress Notes (Addendum)
General Surgery Note  LOS: 2 days  POD# 2  Assessment/Plan: 1.  APPENDECTOMY LAPAROSCOPIC - d. Maddalena Linarez - 05/31/2012  On Cefoxitin  Doing better.  Ready to go home.  D/C instructions reviewed.  D/C dictated:  #[id number]  Subjective:  Passed flatus.  Taking reg diet. Just sore. Objective:   Filed Vitals:   05/31/12 0556  BP: 138/62  Pulse: 73  Temp: 98.7 F (37.1 C)  Resp: 18     Intake/Output from previous day:  09/07 0701 - 09/08 0700 In: 3983.3 [P.O.:700; I.V.:3183.3; IV Piggyback:100] Out: 3200 [Urine:3200]  Intake/Output this shift:      Physical Exam:   General: WN AA F who is alert and oriented.    HEENT: Normal. Pupils equal. .   Lungs: Clear   Abdomen: Soft, BS present.   Wound: Clean.   Neurologic:  Grossly intact to motor and sensory function.   Psychiatric: Has normal mood and affect.   Lab Results:    [facility] 05/29/12 2024 05/29/12 1433  WBC 11.6* 15.0*  HGB 13.1 14.1  HCT 37.3 43.4  PLT 325 --    BMET   Basename 05/29/12 2024  NA 133*  K 3.2*  CL 100  CO2 19  GLUCOSE 89  BUN 9  CREATININE 0.70  CALCIUM 8.5    PT/INR  No results found for this basename: LABPROT:2,INR:2 in the last 72 hours  ABG  No results found for this basename: PHART:2,PCO2:2,PO2:2,HCO3:2 in the last 72 hours   Studies/Results:  Ct Abdomen Pelvis W Contrast  05/29/2012  *RADIOLOGY REPORT*  Clinical Data: Right lower quadrant pain.  Elevated white blood cell count.  CT ABDOMEN AND PELVIS WITH CONTRAST  Technique:  Multidetector CT imaging of the abdomen and pelvis was performed following the standard protocol during bolus administration of intravenous contrast.  Contrast: OMNIPAQUE IOHEXOL 300 MG/ML  SOLN  Comparison: No priors.  Findings:  Lung Bases: Unremarkable.  Abdomen/Pelvis:  The appendix is dilated up to 12 mm in diameter and appears inflamed.  Image 67 of series 2 demonstrates an appendicolith at the neck of the appendix.  Inflammatory changes are seen  surrounding the appendix and the adjacent cecum, without a well-defined periappendiceal abscess at this time. No pneumoperitoneum.  The enhanced appearance of the liver, gallbladder, pancreas, spleen, bilateral adrenal glands and bilateral kidneys is unremarkable.  There is a trace volume of free fluid in the cul-de- sac, presumably physiologic in this young female patient.  Uterus and left ovary are unremarkable in appearance.  Probable small degenerating corpus luteum cyst in the left ovary.  There are a few colonic diverticula, without surrounding inflammatory changes to suggest acute diverticulitis at this time.  Urinary bladder is unremarkable in appearance.  Musculoskeletal: There are no aggressive appearing lytic or blastic lesions noted in the visualized portions of the skeleton.  IMPRESSION: 1.  Acute appendicitis secondary to an obstructing appendicolith in the neck of the appendix.  At this time, there are extensive surrounding periappendiceal inflammatory changes, but no definite periappendiceal abscess or evidence to suggest frank perforation. 2.  Additional incidental findings, as above.  Critical Value/emergent results were called by telephone at the time of interpretation on 05/29/2012 at 07:33 p.m. to Dr. Rhunette Croft, who verbally acknowledged these results.   Original Report Authenticated By: Florencia Reasons, M.D.      Anti-infectives:   Anti-infectives     Start     Dose/Rate Route Frequency Ordered Stop   05/30/12 0600   cefOXitin (MEFOXIN)  2 g in dextrose 5 % 50 mL IVPB        2 g 100 mL/hr over 30 Minutes Intravenous Every 6 hours 05/30/12 0050            Ovidio Kin, MD, FACS Pager: [phone],   [facility] Office: [phone] 05/31/2012

## 2012-06-01 ENCOUNTER — Encounter (HOSPITAL_COMMUNITY): Payer: Self-pay | Admitting: Surgery

## 2012-06-01 NOTE — Discharge Summary (Signed)
Jill Bell, Jill Bell                ACCOUNT NO.:  [id number]  MEDICAL RECORD NO.:  [id number]  LOCATION:  1524                         FACILITY:  [facility]  PHYSICIAN:  Sandria Bales. Ezzard Standing, M.D.  DATE OF BIRTH:  04-Dec-1980  DATE OF ADMISSION:  05/29/2012 DATE OF DISCHARGE:  05/31/2012                              DISCHARGE SUMMARY  DISCHARGE DIAGNOSES: 1. Acute suppurative appendicitis. 2. Few colonic diverticula seen on CT scan. 3. Recent lipoma removed from left forehead by Dr. Etter Sjogren.  OPERATION PERFORMED:  The patient underwent a laparoscopic appendectomy by Dr. Ovidio Kin on May 29, 2012.  HISTORY OF ILLNESS:  This is an otherwise healthy 31 year old African American female who has no identified primary medical doctor.  She presented to the [facility] Emergency Room with a 24-hour history of abdominal pain.  A CT scan was consistent with acute appendicitis.  I was consulted and I took her to the operating room on the day of admission where I found acute suppurative appendicitis.  She was sore on the first postoperative day and not ready for discharge.  She is now 2 days postop was afebrile, doing better and ready for discharge.  Her discharge instructions include that she can return to the work on June 09, 2012, she was given a note for that effect.  From activity, she can drive in a couple of days if she is doing well. No heavy lifting for more than 15 pounds for about a 5 more days, but then there should be no limits.  She can shower when she gets home.  Her diet is as tolerated.  She is to follow up with me and see me in 2-3 weeks for followup.  Call for an appointment.  I have given her Vicodin for pain and want to take 5 more days of Septra.  Her discharge condition is good.  Her final pathology is pending.   Sandria Bales. Ezzard Standing, M.D., FACS   DHN/MEDQ  D:  05/31/2012  T:  06/01/2012  Job:  [id number]  cc:   Etter Sjogren, M.D. Fax: [phone]

## 2012-06-18 ENCOUNTER — Ambulatory Visit (INDEPENDENT_AMBULATORY_CARE_PROVIDER_SITE_OTHER): Payer: Self-pay | Admitting: Surgery

## 2012-06-18 ENCOUNTER — Encounter (INDEPENDENT_AMBULATORY_CARE_PROVIDER_SITE_OTHER): Payer: Self-pay | Admitting: Surgery

## 2012-06-18 VITALS — BP 120/82 | HR 60 | Temp 98.7°F | Resp 18 | Ht 65.0 in | Wt 191.5 lb

## 2012-06-18 DIAGNOSIS — Z9049 Acquired absence of other specified parts of digestive tract: Secondary | ICD-10-CM | POA: Insufficient documentation

## 2012-06-18 DIAGNOSIS — Z9889 Other specified postprocedural states: Secondary | ICD-10-CM

## 2012-06-18 NOTE — Progress Notes (Signed)
[facility]  Ovidio Kin, MD,  FACS [address],  [address] [place], [place]    [address] Phone:  [phone] FAX:  [phone]   Re:   Jill Bell DOB:   [birth year] MRN:   [id number]  Post Op Note  ASSESSMENT AND PLAN: 1.  S/P lap appendectomy - 05/29/2012 - D. Sherylann Vangorden  She has done well.  No complaints.  Return PRN  2. Few colonic diverticula seen on CT scan.  3. Recent lipoma removed from left forehead by Dr. Etter Sjogren.  HISTORY OF PRESENT ILLNESS: Chief Complaint  Patient presents with  . Routine Post Op   Jill Bell is a 31 y.o. (DOB: 09/24/1980)  AA female who is a patient of Provider Not In System and comes to me today for follfow up of recent lap appendectomy.  She has done well.  No complaints or issues.  PHYSICAL EXAM: BP 120/82  Pulse 60  Temp 98.7 F (37.1 C) (Oral)  Resp 18  Ht 5\' 5"  (1.651 m)  Wt 191 lb 8 oz (86.864 kg)  BMI 31.87 kg/m2  LMP 05/08/2012  Abdomen:  Soft.  Incisions look good.  DATA REVIEWED: Path to patient.   Ovidio Kin, MD, FACS Office:  [phone]

## 2015-07-17 ENCOUNTER — Encounter: Payer: Self-pay | Admitting: Physician Assistant

## 2015-07-17 ENCOUNTER — Ambulatory Visit (INDEPENDENT_AMBULATORY_CARE_PROVIDER_SITE_OTHER): Payer: Self-pay

## 2015-07-17 ENCOUNTER — Ambulatory Visit (INDEPENDENT_AMBULATORY_CARE_PROVIDER_SITE_OTHER): Payer: Self-pay | Admitting: Internal Medicine

## 2015-07-17 VITALS — BP 128/88 | HR 95 | Temp 98.3°F | Resp 22

## 2015-07-17 DIAGNOSIS — J309 Allergic rhinitis, unspecified: Secondary | ICD-10-CM

## 2015-07-17 DIAGNOSIS — R0602 Shortness of breath: Secondary | ICD-10-CM

## 2015-07-17 NOTE — Patient Instructions (Signed)
Purchase an OTC oral antihistamine (Claritin, Allegra or Zyrtec). Take one each day. If your symptoms are not improving, please return for additional evaluation. Ask about moving the smoking area further away from your space at work.

## 2015-07-17 NOTE — Progress Notes (Signed)
Subjective:   Patient ID: Jill Bell, female     DOB: May 16, 1981, 34 y.o.    MRN: [id number]  PCP: PROVIDER NOT IN SYSTEM  Chief Complaint  Patient presents with  . Shortness of Breath    all weekend. Became worse this morning while driving to work.     HPI  Presents for evaluation of Shortness of breath. She required urgent attention when she had difficulty ambulating to the exam room, complaining that she couldn't breathe. Accompanied by her mother.   She was placed in a wheelchair and taken to the exam room. In tears, breathing rapidly, in obvious distress.  Her mother reports that she has complained of SOB for the past 3 days, but that it got a lot worse this morning. No recent illness. No congestion, sore throat, drainage. No cough (though she has coughed today during interview and exam). No fever/chills. No nausea/vomiting, diarrhea.  No previous episodes like this.  Uses condoms consistently. No hormone use.  "Do you think I'm having a panic attack?"  Prior to Admission medications   Medication Sig Start Date End Date Taking? Authorizing Provider  Cholecalciferol (VITAMIN D PO) Take by mouth daily.    Historical Provider, MD  Cyanocobalamin (B-12 PO) Take by mouth daily.    Historical Provider, MD  FOLIC ACID PO Take by mouth daily.    Historical Provider, MD  Multiple Vitamin (MULTIVITAMIN WITH MINERALS) TABS Take 1 tablet by mouth daily.    Historical Provider, MD     Allergies  Allergen Reactions  . Latex Hives  . Avelox [Moxifloxacin Hcl In Nacl]     Pt's mother is allergic to avelox and pt doesn't want to take this     Patient Active Problem List   Diagnosis Date Noted  . History of appendectomy, 05/29/2012. 06/18/2012     Family History  Problem Relation Age of Onset  . Diabetes Mother   . Heart disease Mother   . Asthma Mother   . Aneurysm Father      Social History   Social History  . Marital Status: Single    Spouse Name: N/A  .  Number of Children: N/A  . Years of Education: N/A   Occupational History  . Not on file.   Social History Main Topics  . Smoking status: Never Smoker   . Smokeless tobacco: Never Used  . Alcohol Use: No  . Drug Use: No  . Sexual Activity: Not on file   Other Topics Concern  . Not on file   Social History Narrative        Review of Systems  Constitutional: Negative for fever and chills.  HENT: Negative for congestion, ear pain, postnasal drip, rhinorrhea, sinus pressure, sore throat, trouble swallowing and voice change.   Eyes: Negative for visual disturbance.  Respiratory: Positive for shortness of breath. Negative for cough and wheezing.   Cardiovascular: Negative for chest pain, palpitations and leg swelling.  Gastrointestinal: Negative for nausea, vomiting, diarrhea and constipation.  Genitourinary: Negative for dysuria, urgency and frequency.         Objective:  Physical Exam  Constitutional: She appears well-developed and well-nourished. She appears distressed.  BP 128/88 mmHg  Pulse 95  Temp(Src) 98.3 F (36.8 C) (Oral)  Resp 22  SpO2 97%  LMP 06/19/2015 (Approximate)   HENT:  Head: Normocephalic and atraumatic.  Allergic crease noted  Eyes: Conjunctivae are normal.  Neck: No thyromegaly present.  Cardiovascular: Regular rhythm and normal heart sounds.  Pulmonary/Chest: Breath sounds normal. Accessory muscle usage present. She is in respiratory distress.  Lymphadenopathy:    She has no cervical adenopathy.  Skin: Skin is warm and dry. No rash noted. She is not diaphoretic.  Psychiatric: Her mood appears anxious.    CXR: [facility] reading (PRIMARY) by  Dr. Perrin MalteseGuest. Poor inhalation. No evidence of CHF. No infiltrate. No pneumothorax.   Re-examination after CXR revealed a calm and relaxed patient. No longer in any distress. Not crying. O2 removed and she maintained her ease of breathing while we talked and re-examined her and while she  re-dressed.        Assessment & Plan:  1. Shortness of breath Suspect her symptoms were triggered by exposure to tobacco smoke, as she reports milder symptoms from taht in the past. She probably became very scared and that exacerbated her symptoms. Encouraged good control of her allergies, which can also reduce response to irritants. If symptoms recur, RTC. - DG Chest 2 View; Future  2. Allergic rhinitis, unspecified allergic rhinitis type OTC oral antihistamine daily.   Fernande Brashelle S. Emiliya Chretien, PA-C Physician Assistant-Certified [facility]

## 2016-11-14 IMAGING — CR DG CHEST 2V
2 series · 2 of 2 positions shown · non-contrast
Comparison: None.

CLINICAL DATA: Shortness of breath following panic attack

EXAM:
CHEST - 2 VIEW

[PA]
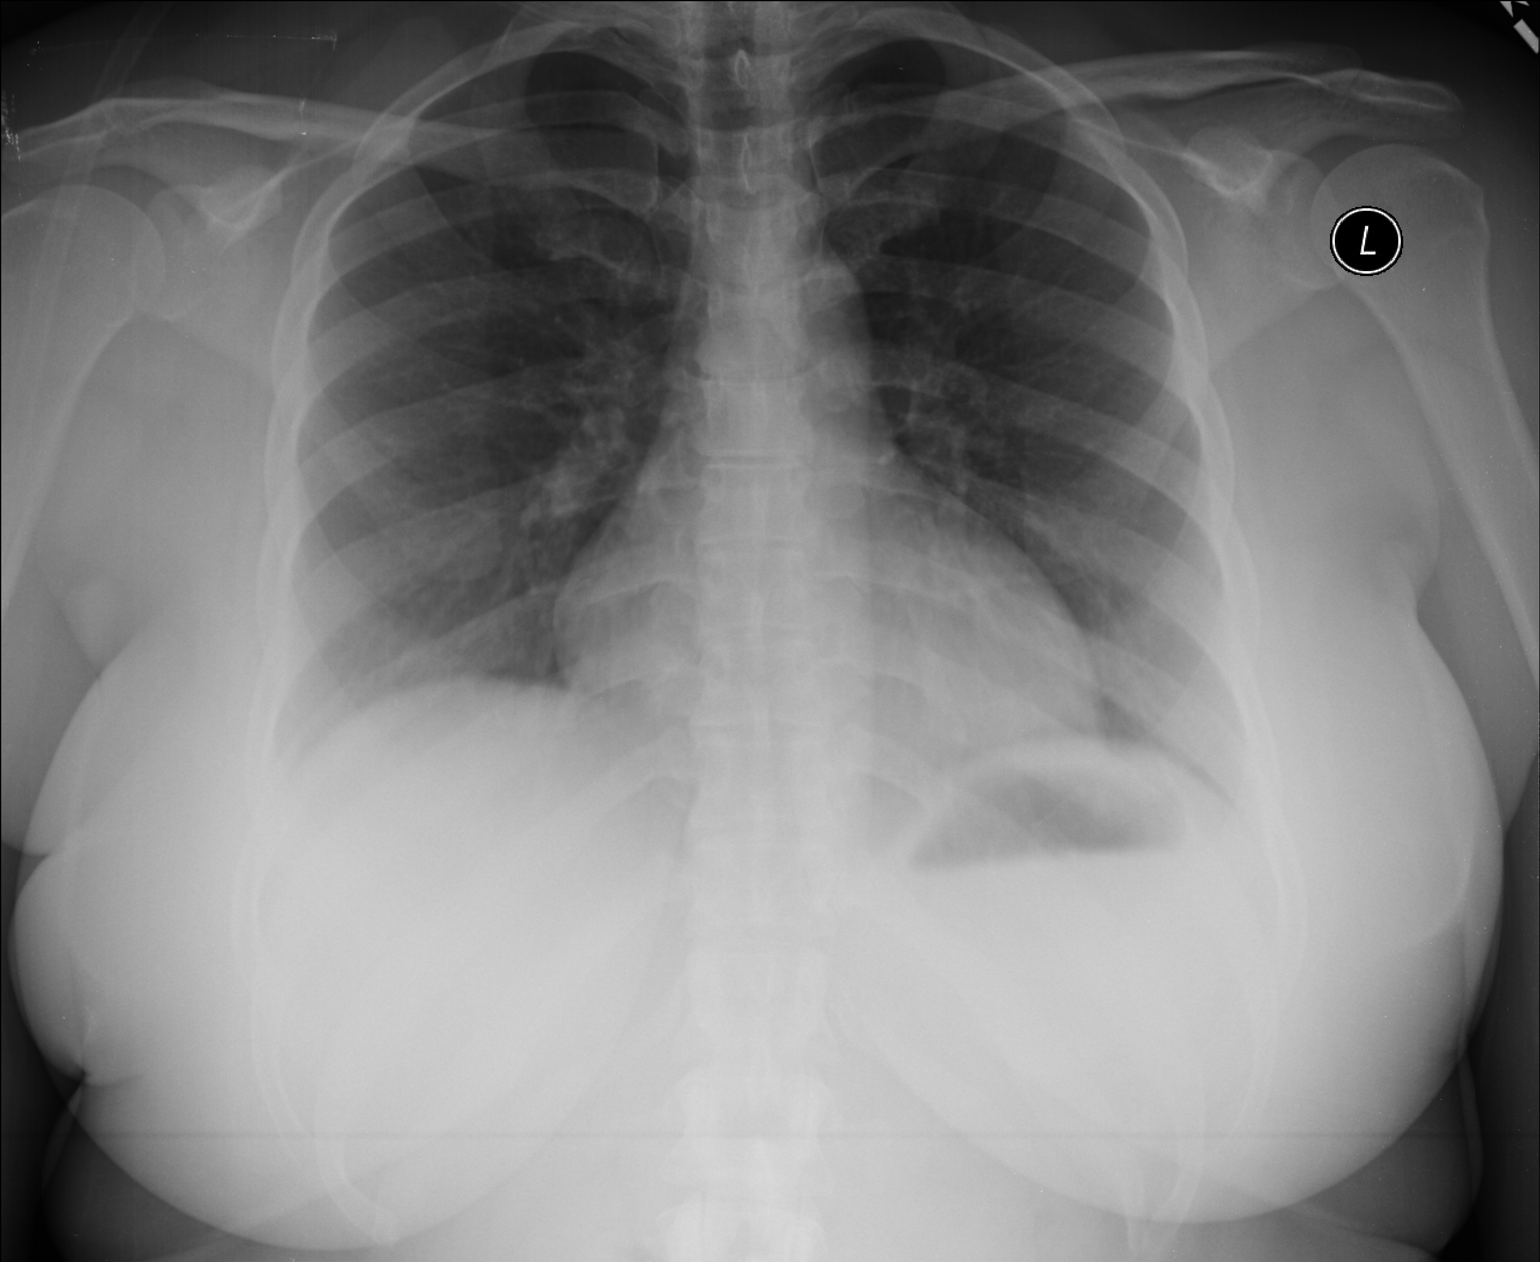

[lateral]
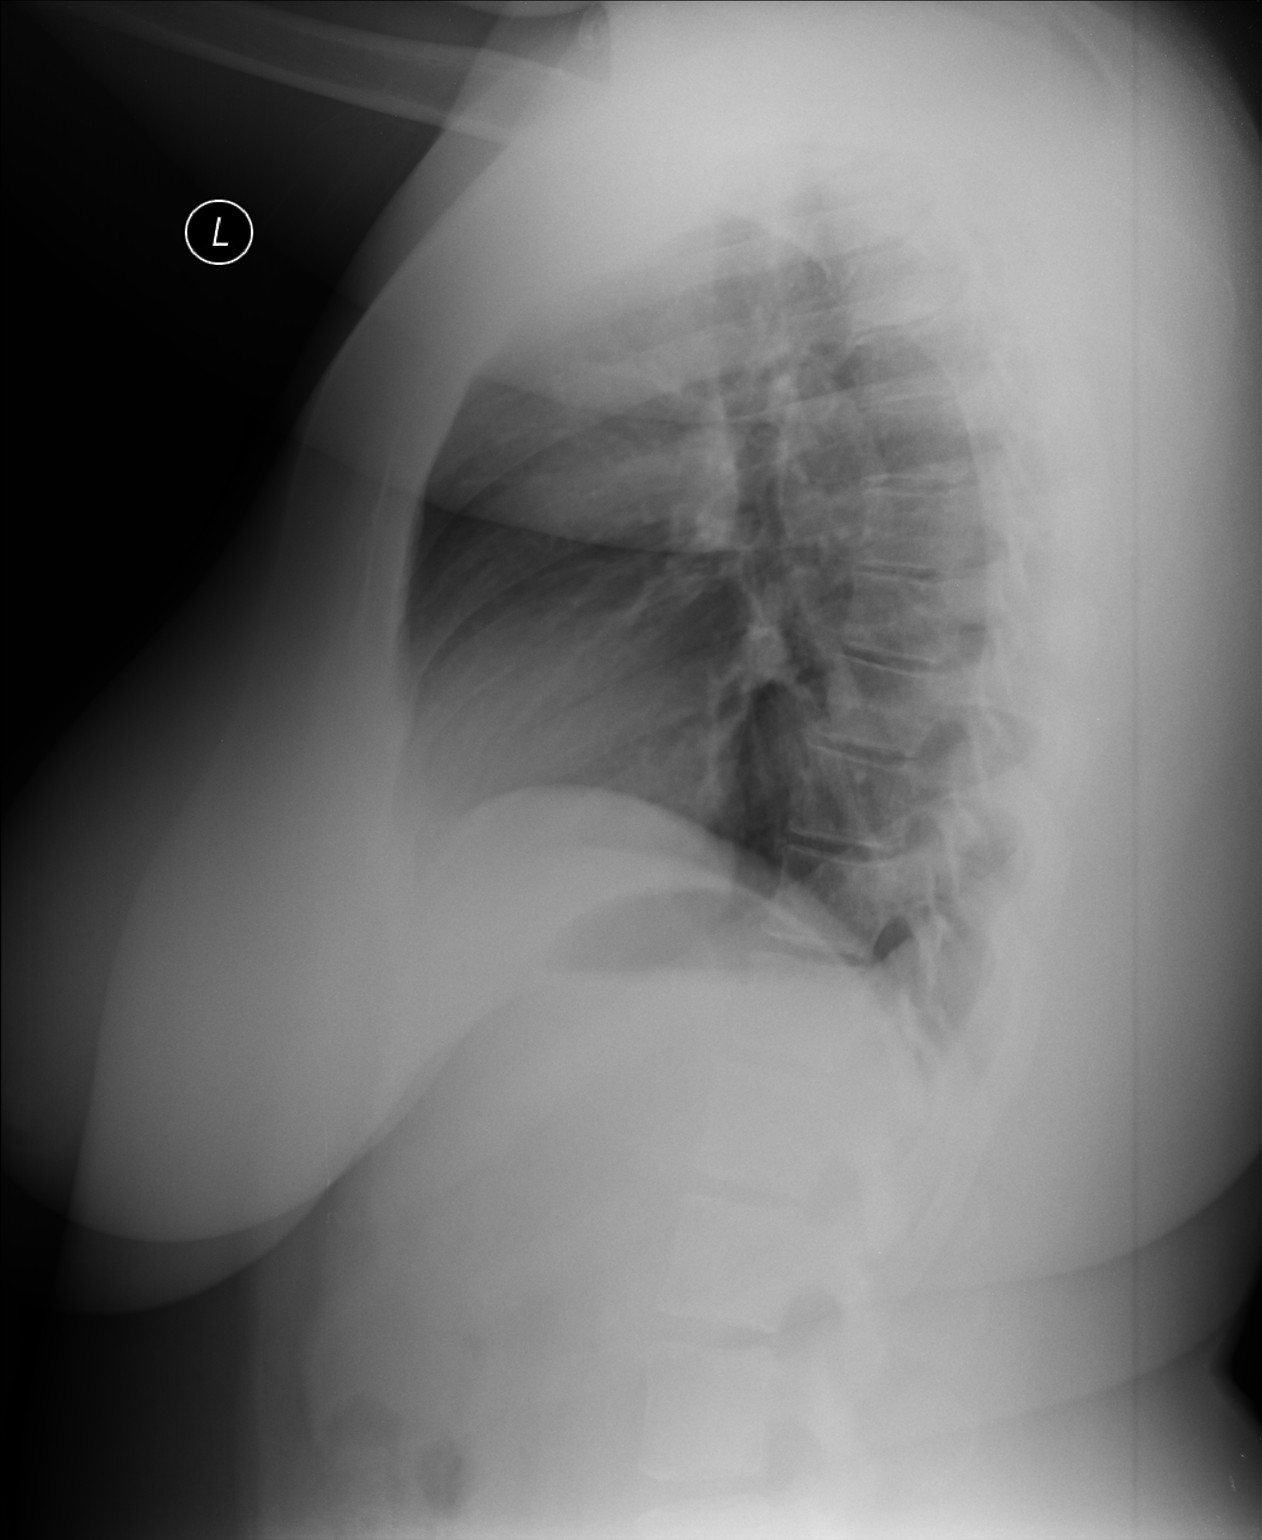

[2 of 2 positions shown; findings below may reference images not displayed]

FINDINGS: The heart size and mediastinal contours are within normal limits.
Both lungs are clear. The visualized skeletal structures are
unremarkable.
IMPRESSION: No active disease.

## 2022-10-31 ENCOUNTER — Other Ambulatory Visit: Payer: Self-pay | Admitting: Internal Medicine

## 2022-10-31 DIAGNOSIS — Z1231 Encounter for screening mammogram for malignant neoplasm of breast: Secondary | ICD-10-CM

## 2022-11-13 ENCOUNTER — Ambulatory Visit
Admission: RE | Admit: 2022-11-13 | Discharge: 2022-11-13 | Disposition: A | Discharge status: Home / Self Care | Payer: BC Managed Care – PPO | Source: Ambulatory Visit | Attending: Internal Medicine | Admitting: Internal Medicine

## 2022-11-13 DIAGNOSIS — Z1231 Encounter for screening mammogram for malignant neoplasm of breast: Secondary | ICD-10-CM

## 2022-11-15 ENCOUNTER — Other Ambulatory Visit: Payer: Self-pay | Admitting: Internal Medicine

## 2022-11-15 DIAGNOSIS — R928 Other abnormal and inconclusive findings on diagnostic imaging of breast: Secondary | ICD-10-CM

## 2022-11-27 ENCOUNTER — Ambulatory Visit
Admission: RE | Admit: 2022-11-27 | Discharge: 2022-11-27 | Disposition: A | Discharge status: Home / Self Care | Payer: BC Managed Care – PPO | Source: Ambulatory Visit | Attending: Internal Medicine | Admitting: Internal Medicine

## 2022-11-27 ENCOUNTER — Other Ambulatory Visit: Payer: Self-pay | Admitting: Internal Medicine

## 2022-11-27 DIAGNOSIS — R928 Other abnormal and inconclusive findings on diagnostic imaging of breast: Secondary | ICD-10-CM

## 2022-11-27 DIAGNOSIS — N6489 Other specified disorders of breast: Secondary | ICD-10-CM

## 2023-06-02 ENCOUNTER — Other Ambulatory Visit: Payer: BC Managed Care – PPO

## 2023-06-03 ENCOUNTER — Other Ambulatory Visit: Payer: Self-pay | Admitting: Internal Medicine

## 2023-06-03 ENCOUNTER — Ambulatory Visit: Payer: BC Managed Care – PPO

## 2023-06-03 ENCOUNTER — Ambulatory Visit
Admission: RE | Admit: 2023-06-03 | Discharge: 2023-06-03 | Disposition: A | Discharge status: Home / Self Care | Payer: BC Managed Care – PPO | Source: Ambulatory Visit | Attending: Internal Medicine | Admitting: Internal Medicine

## 2023-06-03 DIAGNOSIS — R928 Other abnormal and inconclusive findings on diagnostic imaging of breast: Secondary | ICD-10-CM

## 2023-06-03 DIAGNOSIS — N6489 Other specified disorders of breast: Secondary | ICD-10-CM

## 2023-11-17 ENCOUNTER — Ambulatory Visit
Admission: RE | Admit: 2023-11-17 | Discharge: 2023-11-17 | Disposition: A | Discharge status: Home / Self Care | Payer: 59 | Source: Ambulatory Visit | Attending: Internal Medicine | Admitting: Internal Medicine

## 2023-11-17 DIAGNOSIS — N6489 Other specified disorders of breast: Secondary | ICD-10-CM
